# Patient Record
Sex: Female | Born: 1985 | Race: White | Hispanic: No | Marital: Married | Smoking: Never smoker
Health system: Southern US, Community
[De-identification: ages and names within clinical notes are randomized; demographics above are authoritative.]

## PROBLEM LIST (undated history)

## (undated) ENCOUNTER — Inpatient Hospital Stay (HOSPITAL_COMMUNITY): Payer: Self-pay

## (undated) DIAGNOSIS — J45909 Unspecified asthma, uncomplicated: Secondary | ICD-10-CM

## (undated) DIAGNOSIS — I341 Nonrheumatic mitral (valve) prolapse: Secondary | ICD-10-CM

---

## 2017-08-16 ENCOUNTER — Other Ambulatory Visit: Payer: Self-pay

## 2017-08-16 ENCOUNTER — Encounter (HOSPITAL_COMMUNITY): Payer: Self-pay | Admitting: *Deleted

## 2017-08-16 ENCOUNTER — Inpatient Hospital Stay (HOSPITAL_COMMUNITY)
Admission: AD | Admit: 2017-08-16 | Discharge: 2017-08-16 | Disposition: A | Discharge status: Home / Self Care | Payer: PPO | Source: Ambulatory Visit | Attending: Obstetrics and Gynecology | Admitting: Obstetrics and Gynecology

## 2017-08-16 ENCOUNTER — Inpatient Hospital Stay (HOSPITAL_COMMUNITY): Payer: PPO

## 2017-08-16 DIAGNOSIS — O009 Unspecified ectopic pregnancy without intrauterine pregnancy: Secondary | ICD-10-CM | POA: Insufficient documentation

## 2017-08-16 DIAGNOSIS — O00101 Right tubal pregnancy without intrauterine pregnancy: Secondary | ICD-10-CM

## 2017-08-16 DIAGNOSIS — O209 Hemorrhage in early pregnancy, unspecified: Secondary | ICD-10-CM

## 2017-08-16 DIAGNOSIS — N939 Abnormal uterine and vaginal bleeding, unspecified: Secondary | ICD-10-CM | POA: Diagnosis present

## 2017-08-16 DIAGNOSIS — Z880 Allergy status to penicillin: Secondary | ICD-10-CM | POA: Insufficient documentation

## 2017-08-16 HISTORY — DX: Nonrheumatic mitral (valve) prolapse: I34.1

## 2017-08-16 HISTORY — DX: Unspecified asthma, uncomplicated: J45.909

## 2017-08-16 LAB — URINALYSIS, ROUTINE W REFLEX MICROSCOPIC
BILIRUBIN URINE: NEGATIVE
Glucose, UA: NEGATIVE mg/dL
KETONES UR: NEGATIVE mg/dL
Nitrite: NEGATIVE
Protein, ur: NEGATIVE mg/dL
Specific Gravity, Urine: 1.02 (ref 1.005–1.030)
pH: 5 (ref 5.0–8.0)

## 2017-08-16 LAB — COMPREHENSIVE METABOLIC PANEL
ALBUMIN: 4 g/dL (ref 3.5–5.0)
ALK PHOS: 59 U/L (ref 38–126)
ALT: 15 U/L (ref 0–44)
ANION GAP: 10 (ref 5–15)
AST: 19 U/L (ref 15–41)
BILIRUBIN TOTAL: 0.9 mg/dL (ref 0.3–1.2)
BUN: 15 mg/dL (ref 6–20)
CALCIUM: 9.3 mg/dL (ref 8.9–10.3)
CO2: 20 mmol/L — ABNORMAL LOW (ref 22–32)
Chloride: 109 mmol/L (ref 98–111)
Creatinine, Ser: 0.8 mg/dL (ref 0.44–1.00)
GFR calc Af Amer: >60 mL/min (ref >60)
GLUCOSE: 89 mg/dL (ref 70–99)
POTASSIUM: 4.4 mmol/L (ref 3.5–5.1)
Sodium: 139 mmol/L (ref 135–145)
TOTAL PROTEIN: 6.8 g/dL (ref 6.5–8.1)

## 2017-08-16 LAB — CBC WITH DIFFERENTIAL/PLATELET
Basophils Absolute: 0 10*3/uL (ref 0.0–0.1)
Basophils Relative: 0 %
EOS PCT: 1 %
Eosinophils Absolute: 0.1 10*3/uL (ref 0.0–0.7)
HCT: 37.8 % (ref 36.0–46.0)
Hemoglobin: 12.5 g/dL (ref 12.0–15.0)
LYMPHS ABS: 2.6 10*3/uL (ref 0.7–4.0)
Lymphocytes Relative: 29 %
MCH: 31.5 pg (ref 26.0–34.0)
MCHC: 33.1 g/dL (ref 30.0–36.0)
MCV: 95.2 fL (ref 78.0–100.0)
MONO ABS: 0.4 10*3/uL (ref 0.1–1.0)
MONOS PCT: 4 %
Neutro Abs: 5.9 10*3/uL (ref 1.7–7.7)
Neutrophils Relative %: 66 %
PLATELETS: 320 10*3/uL (ref 150–400)
RBC: 3.97 MIL/uL (ref 3.87–5.11)
RDW: 13.1 % (ref 11.5–15.5)
WBC: 9 10*3/uL (ref 4.0–10.5)

## 2017-08-16 LAB — POCT PREGNANCY, URINE: Preg Test, Ur: POSITIVE — AB

## 2017-08-16 LAB — ABO/RH: ABO/RH(D): A POS

## 2017-08-16 LAB — HCG, QUANTITATIVE, PREGNANCY: hCG, Beta Chain, Quant, S: 156 m[IU]/mL — ABNORMAL HIGH (ref <5)

## 2017-08-16 MED ORDER — METHOTREXATE INJECTION FOR WOMEN'S HOSPITAL
50.0000 mg/m2 | Freq: Once | INTRAMUSCULAR | Status: AC
  Administered 2017-08-16: 80 mg via INTRAMUSCULAR
  Filled 2017-08-16: qty 1.6

## 2017-08-16 NOTE — MAU Provider Note (Signed)
History     CSN: [id number]  Arrival date and time: 08/16/17 1315   First Provider Initiated Contact with Patient 08/16/17 1354      Chief Complaint  Patient presents with  . Possible Pregnancy  . Vaginal Bleeding  . Abdominal Pain   HPI  Ms. Tiffany [name] is a 32 y.o. G1P0 at [redacted]w[redacted]d who presents to MAU today with complaint of vaginal bleeding and abdominal pain. The patient had what she thought was a normal period on 6/22. On 6/29 bleeding was much heavier and has been intermittent with clots since then. This morning she passed something that looked more like a sac than a clot. She states cramping is much less now and has not taken anything for pain recently. She denies N/V or fever.    OB History    Gravida  1   Para      Term      Preterm      AB      Living        SAB      TAB      Ectopic      Multiple      Live Births              Past Medical History:  Diagnosis Date  . Asthma   . Mitral valve prolapse     History reviewed. No pertinent surgical history.  History reviewed. No pertinent family history.  Social History   Tobacco Use  . Smoking status: Never Smoker  . Smokeless tobacco: Never Used  Substance Use Topics  . Alcohol use: Never    Frequency: Never  . Drug use: Never    Allergies:  Allergies  Allergen Reactions  . Penicillins Rash    No medications prior to admission.    Review of Systems  Constitutional: Negative for fever.  Gastrointestinal: Positive for abdominal pain. Negative for constipation, diarrhea, nausea and vomiting.  Genitourinary: Positive for vaginal bleeding. Negative for vaginal discharge.   Physical Exam   Blood pressure 120/68, pulse 75, temperature 98.3 F (36.8 C), temperature source Oral, resp. rate 16, height 5\' 2"  (1.575 m), weight 131 lb 4 oz (59.5 kg), last menstrual period 07/12/2017, SpO2 100 %.  Physical Exam  Nursing note and vitals reviewed. Constitutional: She is oriented to  person, place, and time. She appears well-developed and well-nourished. No distress.  HENT:  Head: Normocephalic and atraumatic.  Cardiovascular: Normal rate.  Respiratory: Effort normal.  GI: Soft. She exhibits no distension and no mass. There is tenderness (mild) in the right lower quadrant. There is no rebound and no guarding.  Genitourinary: Uterus is not enlarged and not tender. Cervix exhibits no motion tenderness, no discharge and no friability. Right adnexum displays tenderness (mild). Right adnexum displays no mass. Left adnexum displays no mass and no tenderness. There is bleeding (small, dark brown) in the vagina. No vaginal discharge found.  Neurological: She is alert and oriented to person, place, and time.  Skin: Skin is warm and dry. No erythema.  Psychiatric: She has a normal mood and affect.     Results for orders placed or performed during the hospital encounter of 08/16/17 (from the past 24 hour(s))  Urinalysis, Routine w reflex microscopic     Status: Abnormal   Collection Time: 08/16/17  1:51 PM  Result Value Ref Range   Color, Urine YELLOW YELLOW   APPearance HAZY (A) CLEAR   Specific Gravity, Urine 1.020 1.005 - 1.030  pH 5.0 5.0 - 8.0   Glucose, UA NEGATIVE NEGATIVE mg/dL   Hgb urine dipstick LARGE (A) NEGATIVE   Bilirubin Urine NEGATIVE NEGATIVE   Ketones, ur NEGATIVE NEGATIVE mg/dL   Protein, ur NEGATIVE NEGATIVE mg/dL   Nitrite NEGATIVE NEGATIVE   Leukocytes, UA SMALL (A) NEGATIVE   RBC / HPF 0-5 0 - 5 RBC/hpf   WBC, UA 11-20 0 - 5 WBC/hpf   Bacteria, UA RARE (A) NONE SEEN   Squamous Epithelial / LPF 11-20 0 - 5   Mucus PRESENT   Pregnancy, urine POC     Status: Abnormal   Collection Time: 08/16/17  1:56 PM  Result Value Ref Range   Preg Test, Ur POSITIVE (A) NEGATIVE  CBC with Differential/Platelet     Status: None   Collection Time: 08/16/17  2:12 PM  Result Value Ref Range   WBC 9.0 4.0 - 10.5 K/uL   RBC 3.97 3.87 - 5.11 MIL/uL   Hemoglobin 12.5  12.0 - 15.0 g/dL   HCT 16.137.8 09.636.0 - 04.546.0 %   MCV 95.2 78.0 - 100.0 fL   MCH 31.5 26.0 - 34.0 pg   MCHC 33.1 30.0 - 36.0 g/dL   RDW 40.913.1 81.111.5 - 91.415.5 %   Platelets 320 150 - 400 K/uL   Neutrophils Relative % 66 %   Neutro Abs 5.9 1.7 - 7.7 K/uL   Lymphocytes Relative 29 %   Lymphs Abs 2.6 0.7 - 4.0 K/uL   Monocytes Relative 4 %   Monocytes Absolute 0.4 0.1 - 1.0 K/uL   Eosinophils Relative 1 %   Eosinophils Absolute 0.1 0.0 - 0.7 K/uL   Basophils Relative 0 %   Basophils Absolute 0.0 0.0 - 0.1 K/uL  ABO/Rh     Status: None (Preliminary result)   Collection Time: 08/16/17  2:12 PM  Result Value Ref Range   ABO/RH(D)      A POS Performed at [facility], [address] [place], [place] [id number]   hCG, quantitative, pregnancy     Status: Abnormal   Collection Time: 08/16/17  2:12 PM  Result Value Ref Range   hCG, Beta Chain, Quant, S 156 (H) <5 mIU/mL  Comprehensive metabolic panel     Status: Abnormal   Collection Time: 08/16/17  2:12 PM  Result Value Ref Range   Sodium 139 135 - 145 mmol/L   Potassium 4.4 3.5 - 5.1 mmol/L   Chloride 109 98 - 111 mmol/L   CO2 20 (L) 22 - 32 mmol/L   Glucose, Bld 89 70 - 99 mg/dL   BUN 15 6 - 20 mg/dL   Creatinine, Ser 6.210.80 0.44 - 1.00 mg/dL   Calcium 9.3 8.9 - 30.810.3 mg/dL   Total Protein 6.8 6.5 - 8.1 g/dL   Albumin 4.0 3.5 - 5.0 g/dL   AST 19 15 - 41 U/L   ALT 15 0 - 44 U/L   Alkaline Phosphatase 59 38 - 126 U/L   Total Bilirubin 0.9 0.3 - 1.2 mg/dL   GFR calc non Af Amer >60 >60 mL/min   GFR calc Af Amer >60 >60 mL/min   Anion gap 10 5 - 15   [place] Ob Comp Less 14 Wks  Result Date: 08/16/2017 CLINICAL DATA:  Vaginal bleeding.  Uncertain LMP. EXAM: OBSTETRIC <14 WK US AND TRANSVAGINAL OB US TECHNIQUE: Both transabdominal and transvaginal ultrasound examinations were performed for complete evaluation of the gestation as well as the maternal uterus, adnexal regions, and pelvic cul-de-sac. Transvaginal  technique was performed to assess  early pregnancy. COMPARISON:  None. FINDINGS: Intrauterine gestational sac: None Maternal uterus/adnexae: Thin endometrium is seen measuring 4 mm. The left ovary is normal appearance. A heterogeneous mass is seen in the inferior right adnexa and cul-de-sac, which abuts the right ovary but appears separate from it. This mass measures 4.5 x 3.2 x 2.9 cm. There is a tiny amount of simple free fluid in the pelvic cul-de-sac. IMPRESSION: 4.5 cm right adnexal mass, highly suspicious for ectopic pregnancy. Tiny amount of simple free fluid in cul-de-sac. Critical Value/emergent results were called by telephone at the time of interpretation on 08/16/2017 at 3:01 pm to Abby Polos in MAU, who verbally acknowledged these results. Electronically Signed   By: Myles Rosenthal M.D.   On: 08/16/2017 15:02   US Ob Transvaginal  Result Date: 08/16/2017 CLINICAL DATA:  Vaginal bleeding.  Uncertain LMP. EXAM: OBSTETRIC <14 WK [place] AND TRANSVAGINAL OB US TECHNIQUE: Both transabdominal and transvaginal ultrasound examinations were performed for complete evaluation of the gestation as well as the maternal uterus, adnexal regions, and pelvic cul-de-sac. Transvaginal technique was performed to assess early pregnancy. COMPARISON:  None. FINDINGS: Intrauterine gestational sac: None Maternal uterus/adnexae: Thin endometrium is seen measuring 4 mm. The left ovary is normal appearance. A heterogeneous mass is seen in the inferior right adnexa and cul-de-sac, which abuts the right ovary but appears separate from it. This mass measures 4.5 x 3.2 x 2.9 cm. There is a tiny amount of simple free fluid in the pelvic cul-de-sac. IMPRESSION: 4.5 cm right adnexal mass, highly suspicious for ectopic pregnancy. Tiny amount of simple free fluid in cul-de-sac. Critical Value/emergent results were called by telephone at the time of interpretation on 08/16/2017 at 3:01 pm to Abby Polos in MAU, who verbally acknowledged these results. Electronically Signed   By: Myles Rosenthal M.D.   On: 08/16/2017 15:02    MAU Course  Procedures None  MDM +UPT UA, ABO/Rh, quant hCG and [place] today to rule out ectopic pregnancy Right sided mass concerning for ectopic pregnancy noted on [place]. Discussed with Dr. Earlene Plater. She has discussed results and management options with the patient and her husband. They have opted for MTX management. CMP ordered. MTX ordered and given in MAU today.  Assessment and Plan  A: Right ectopic pregnancy  P: Discharge home Tylenol PRN for pain advised Care after MTX instructions given  Ectopic precautions and warning signs for worsening condition discussed Patient advised to follow-up in MAU for day#4 hCG on Saturday or sooner if her condition were to change or worsen   Vonzella Nipple, PA-C 08/16/2017, 6:52 PM

## 2017-08-16 NOTE — Discharge Instructions (Signed)
Methotrexate Treatment for an Ectopic Pregnancy, Care After Refer to this sheet in the next few weeks. These instructions provide you with information on caring for yourself after your procedure. Your health care provider may also give you more specific instructions. Your treatment has been planned according to current medical practices, but problems sometimes occur. Call your health care provider if you have any problems or questions after your procedure. What can I expect after the procedure? You may have some abdominal cramping, vaginal bleeding, and fatigue in the first few days after taking methotrexate. Some other possible side effects of methotrexate include:  Nausea.  Vomiting.  Diarrhea.  Mouth sores.  Swelling or irritation of the lining of your lungs (pneumonitis).  Liver damage.  Hair loss.  Follow these instructions at home: After you have received the methotrexate medicine, you need to be careful of your activities and watch your condition for several weeks. It may take 1 week before your hormone levels return to normal. Activity  Do not have sexual intercourse until your health care provider says it is safe to do so.  You may resume your usual diet.  Limit strenuous activity.  Do not drink alcohol. General instructions  Do not take aspirin, ibuprofen, or naproxen (nonsteroidal anti-inflammatory drugs [NSAIDs]).  Do not take folic acid, prenatal vitamins, or other vitamins that contain folic acid.  Avoid traveling too far away from your health care provider.  Keep all follow-up visits as told by your health care provider. This is important. Contact a health care provider if:  You cannot control your nausea and vomiting.  You cannot control your diarrhea.  You have sores in your mouth and want treatment.  You need pain medicine for your abdominal pain.  You have a rash.  You are having a reaction to the medicine. Get help right away if:  You have  increasing abdominal or pelvic pain.  You notice increased bleeding.  You feel light-headed, or you faint.  You have shortness of breath.  Your heart rate increases.  You have a cough.  You have chills.  You have a fever. This information is not intended to replace advice given to you by your health care provider. Make sure you discuss any questions you have with your health care provider. Document Released: 01/20/2011 Document Revised: 07/09/2015 Document Reviewed: 11/19/2012 Elsevier Interactive Patient Education  2017 Elsevier Inc.   Pelvic Rest Pelvic rest may be recommended if:  Your placenta is partially or completely covering the opening of your cervix (placenta previa).  There is bleeding between the wall of the uterus and the amniotic sac in the first trimester of pregnancy (subchorionic hemorrhage).  You went into labor too early (preterm labor).  Based on your overall health and the health of your baby, your health care provider will decide if pelvic rest is right for you. How do I rest my pelvis? For as long as told by your health care provider:  Do not have sex, sexual stimulation, or an orgasm.  Do not use tampons. Do not douche. Do not put anything in your vagina.  Do not lift anything that is heavier than 10 lb (4.5 kg).  Avoid activities that take a lot of effort (are strenuous).  Avoid any activity in which your pelvic muscles could become strained.  When should I seek medical care? Seek medical care if you have:  Cramping pain in your lower abdomen.  Vaginal discharge.  A low, dull backache.  Regular contractions.  Uterine  tightening.  When should I seek immediate medical care? Seek immediate medical care if:  You have vaginal bleeding and you are pregnant.  This information is not intended to replace advice given to you by your health care provider. Make sure you discuss any questions you have with your health care provider. Document  Released: 05/28/2010 Document Revised: 07/09/2015 Document Reviewed: 08/04/2014 Elsevier Interactive Patient Education  [employer].

## 2017-08-16 NOTE — Progress Notes (Signed)
Faculty Note  Patient is 32 yo Tiffany [name] who presented with heavy bleeding for several days. She started what she thought was regular period on 08/05/17, bled normally for several days, then 4 days ago, bleeding got significantly heavier and was also bright red, which is abnormal for her. She is also passing some mucous discharge today. She reports some right lower pelvic pain about 10 days ago, crampy and intense that resolved on its own. Currently, she is sore in the right lower quadrant but does not really describe it was pain. She was not aware until recently that she was pregnant. This is first pregnancy. She stopped OCPs 12/2016, she and husband are not actively attempting to conceive but she wanted to get her cycle regular again and see what would happen.  PMH: mild asthma, mitral valve prolapse (occasional chest pains but no meds or other complications) PSH: none Allergies: PCN Meds: inhaler  BP 120/68 (BP Location: Right Arm)   Pulse 75   Temp 98.3 F (36.8 C) (Oral)   Resp 16   Wt 131 lb 4 oz (59.5 kg)   LMP 07/12/2017 Comment: ? June  SpO2 100%  Gen: alert, oriented Abd: soft, mildly tender to palpation in RLQ with deep palpation  HCG: 156 CLINICAL DATA:  Vaginal bleeding.  Uncertain LMP.  EXAM: OBSTETRIC <14 WK US AND TRANSVAGINAL OB US  TECHNIQUE: Both transabdominal and transvaginal ultrasound examinations were performed for complete evaluation of the gestation as well as the maternal uterus, adnexal regions, and pelvic cul-de-sac. Transvaginal technique was performed to assess early pregnancy.  COMPARISON:  None.  FINDINGS: Intrauterine gestational sac: None  Maternal uterus/adnexae: Thin endometrium is seen measuring 4 mm. The left ovary is normal appearance. A heterogeneous mass is seen in the inferior right adnexa and cul-de-sac, which abuts the right ovary but appears separate from it. This mass measures 4.5 x 3.2 x 2.9 cm. There is a tiny amount of simple  free fluid in the pelvic cul-de-sac.  IMPRESSION: 4.5 cm right adnexal mass, highly suspicious for ectopic pregnancy. Tiny amount of simple free fluid in cul-de-sac.  Critical Value/emergent results were called by telephone at the time of interpretation on 08/16/2017 at 3:01 pm to Abby Polos in MAU, who verbally acknowledged these results.   Electronically Signed   By: Myles RosenthalJohn  Stahl M.D.   On: 08/16/2017 15:02   A/P: 32 yo Tiffany [name] with vaginal bleeding and mild RLQ in the setting of low HCG and right adnexal mass suspicious for ectopic pregnancy. I reviewed course with patient and husband, concern for ectopic pregnancy versus early IUP (less likely given history of significant bleeding and adnexal mass). I thoroughly reviewed options with them including return in 48 hrs for repeat HCG and ultrasound, (risks/benefits of this including interrupting early pregnancy, possible rupture of ectopic, ectopic becoming too large for methotrexate therapy), getting methotrexate therapy now (risks/benefits including disruption of ectopic pregnancy, disruption of early pregnancy, possible rupture of ectopic, possibility that therapy won't work or would need to be repeated), and surgical management (including not visualizing ectopic, possible disruption of early pregnancy). Answered all questions. They have opted for MTX therapy, please see note from Vonzella NippleJulie Wenzel, PA for further details.  Course discussed with Dr. Despina HiddenEure as well.   Baldemar LenisK. Meryl Davis, M.D. Attending Obstetrician & Gynecologist, [facility], [facility]

## 2017-08-16 NOTE — MAU Note (Signed)
Started having really severe cramping on June 19, cramped another 2 days, bleeding started June 20/21 started as light brown, 22nd- seemed like regular period, just with worse cramping then usual. Then it seemed to go away. On the 29th, bleeding started and got worse, deeper red; heavier w/blood clots.  Better Sunday, Lighter on Mon/ Tues.  This morning had lighter pink, ? Sac  Has not been seen in office for any of this.  6/30 had +HPT.

## 2017-08-19 ENCOUNTER — Encounter (HOSPITAL_COMMUNITY): Payer: Self-pay | Admitting: *Deleted

## 2017-08-19 ENCOUNTER — Inpatient Hospital Stay (HOSPITAL_COMMUNITY)
Admission: AD | Admit: 2017-08-19 | Discharge: 2017-08-19 | Disposition: A | Discharge status: Home / Self Care | Payer: PPO | Source: Ambulatory Visit | Attending: Obstetrics and Gynecology | Admitting: Obstetrics and Gynecology

## 2017-08-19 ENCOUNTER — Inpatient Hospital Stay (HOSPITAL_COMMUNITY): Payer: PPO

## 2017-08-19 DIAGNOSIS — O009 Unspecified ectopic pregnancy without intrauterine pregnancy: Secondary | ICD-10-CM

## 2017-08-19 DIAGNOSIS — O00101 Right tubal pregnancy without intrauterine pregnancy: Secondary | ICD-10-CM | POA: Insufficient documentation

## 2017-08-19 LAB — HCG, QUANTITATIVE, PREGNANCY: HCG, BETA CHAIN, QUANT, S: 39 m[IU]/mL — AB (ref <5)

## 2017-08-19 NOTE — MAU Provider Note (Signed)
Ms. Tiffany BosworthJessica [name]  is a 32 y.o. G1P0  at 5926w3d who presents to MAU today for follow-up quant hCG. This is day 4 s/p MTX for ectopic pregnancy. She reports increase in pain and bleeding today. Rates pain 5/10. Worse in RLQ.   BP 109/64 (BP Location: Right Arm)   Pulse 78   Temp 98.4 F (36.9 C) (Oral)   Resp 16   Wt 131 lb 1.3 oz (59.5 kg)   LMP 07/12/2017 Comment: ? June  SpO2 98% Comment: ra  BMI 23.97 kg/m   GENERAL: Well-developed, well-nourished female in no acute distress.  HEENT: Normocephalic, atraumatic.   LUNGS: Effort normal HEART: Regular rate  SKIN: Warm, dry and without erythema PSYCH: Normal mood and affect   [place] Ob Transvaginal  Result Date: 08/19/2017 CLINICAL DATA:  [age] year old female with right adnexal ectopic pregnancy identified 3 days prior status post 1 dose of methotrexate, now presenting with worsening pelvic pain and vaginal bleeding. EXAM: TRANSVAGINAL OB ULTRASOUND TECHNIQUE: Transvaginal ultrasound was performed for complete evaluation of the gestation as well as the maternal uterus, adnexal regions, and pelvic cul-de-sac. COMPARISON:  08/16/2017 obstetric scan. FINDINGS: Anteverted uterus is normal in size and configuration, with no uterine fibroids or other myometrial abnormalities. No intrauterine gestational sac. Thin endometrium (4 mm bilayer thickness). Trace heterogeneous fluid in the endometrial cavity. Small to moderate free fluid in the pelvic cul-de-sac and right adnexa, not appreciably changed. Right ovary measures 2.8 x 1.9 x 1.9 cm. There is a heterogeneous 2.3 x 1.7 x 2.0 cm right adnexal mass separate from the right ovary, located between the right ovary and uterus, compatible with right tubal ectopic gestation, previously 2.4 x 1.8 x 2.1 cm on 08/16/2017 using similar measurement technique, stable to slightly decreased. No yolk sac or embryo within this right adnexal mass. Additional heterogeneous hypoechoic regions in the right adnexa are unchanged  and compatible with hematosalpinx. Left ovary measures 2.3 x 1.4 x 1.9 cm. No left ovarian or left adnexal mass. IMPRESSION: 1. Right tubal 2.3 x 1.7 x 2.0 cm ectopic gestation, stable to slightly decreased in size since 08/16/2017 scan. No yolk sac or embryo within this mass. 2. Stable hematosalpinx in the right adnexa. Stable small to moderate free fluid in the right adnexa and pelvic cul-de-sac. 3. Thin endometrium with trace blood products in the endometrial cavity. Electronically Signed   By: Delbert PhenixJason A Poff M.D.   On: 08/19/2017 15:32   Results for orders placed or performed during the hospital encounter of 08/19/17 (from the past 24 hour(s))  hCG, quantitative, pregnancy     Status: Abnormal   Collection Time: 08/19/17  1:35 PM  Result Value Ref Range   hCG, Beta Chain, Quant, S 39 (H) <5 mIU/mL   Component     Latest Ref Rng & Units 08/16/2017 08/19/2017  HCG, Beta Chain, Quant, S     <5 mIU/mL 156 (H) 39 (H)     A: 1. Ectopic pregnancy   -significant drop in HCG & decrease in size of adnexal mass (4.5 cm > 2.3 cm) -reviewed presentation, HCG, & ultrasound with Dr. Jolayne Pantheronstant. Ok to discharge home  P: Discharge home Scheduled for day 7 HCG in clinic on Tuesday Discussed reasons to return to MAU  Judeth HornLawrence, Temitope Griffing, NP  08/19/2017 3:49 PM

## 2017-08-19 NOTE — MAU Note (Signed)
Tiffany BosworthJessica [name] is a 32 y.o. at 4317w3d here in MAU reporting:  For follow up Has received methotrexate Reports increase in pain and bleeding today Pain score: 5/10 Vitals:   08/19/17 1314  BP: 109/64  Pulse: 78  Resp: 16  Temp: 98.4 F (36.9 C)  SpO2: 98%     Lab orders placed from triage: none

## 2017-08-19 NOTE — Discharge Instructions (Signed)
Methotrexate Treatment for an Ectopic Pregnancy, Care After °Refer to this sheet in the next few weeks. These instructions provide you with information on caring for yourself after your procedure. Your health care provider may also give you more specific instructions. Your treatment has been planned according to current medical practices, but problems sometimes occur. Call your health care provider if you have any problems or questions after your procedure. °What can I expect after the procedure? °You may have some abdominal cramping, vaginal bleeding, and fatigue in the first few days after taking methotrexate. Some other possible side effects of methotrexate include: °· Nausea. °· Vomiting. °· Diarrhea. °· Mouth sores. °· Swelling or irritation of the lining of your lungs (pneumonitis). °· Liver damage. °· Hair loss. ° °Follow these instructions at home: °After you have received the methotrexate medicine, you need to be careful of your activities and watch your condition for several weeks. It may take 1 week before your hormone levels return to normal. °Activity °· Do not have sexual intercourse until your health care provider says it is safe to do so. °· You may resume your usual diet. °· Limit strenuous activity. °· Do not drink alcohol. °General instructions °· Do not take aspirin, ibuprofen, or naproxen (nonsteroidal anti-inflammatory drugs [NSAIDs]). °· Do not take folic acid, prenatal vitamins, or other vitamins that contain folic acid. °· Avoid traveling too far away from your health care provider. °· Keep all follow-up visits as told by your health care provider. This is important. °Contact a health care provider if: °· You cannot control your nausea and vomiting. °· You cannot control your diarrhea. °· You have sores in your mouth and want treatment. °· You need pain medicine for your abdominal pain. °· You have a rash. °· You are having a reaction to the medicine. °Get help right away if: °· You have  increasing abdominal or pelvic pain. °· You notice increased bleeding. °· You feel light-headed, or you faint. °· You have shortness of breath. °· Your heart rate increases. °· You have a cough. °· You have chills. °· You have a fever. °This information is not intended to replace advice given to you by your health care provider. Make sure you discuss any questions you have with your health care provider. °Document Released: 01/20/2011 Document Revised: 07/09/2015 Document Reviewed: 11/19/2012 °Elsevier Interactive Patient Education © 2017 Elsevier Inc. ° °

## 2017-08-22 ENCOUNTER — Ambulatory Visit (INDEPENDENT_AMBULATORY_CARE_PROVIDER_SITE_OTHER): Payer: PPO

## 2017-08-22 DIAGNOSIS — Z8759 Personal history of other complications of pregnancy, childbirth and the puerperium: Secondary | ICD-10-CM

## 2017-08-22 LAB — HCG, QUANTITATIVE, PREGNANCY: HCG, BETA CHAIN, QUANT, S: 9 m[IU]/mL — AB (ref <5)

## 2017-08-22 NOTE — Progress Notes (Signed)
Pt here today for STAT beta lab s/p day 7 MTX.  Pt reports having some minor cramping and light spotting.  Pt advised to wait in the waiting room for approx two hours to receive results and f/u.  Pt agreed.   Notified Sharen CounterLisa Leftwich-Kirby, CNM pt's results.  Provider recommendation to f/u in one week for non-stat beta lab to determine f/u at that time.  Informed pt of provider's recommendation.  Pt stated understanding.

## 2017-08-28 ENCOUNTER — Other Ambulatory Visit: Payer: Self-pay | Admitting: *Deleted

## 2017-08-28 DIAGNOSIS — Z8759 Personal history of other complications of pregnancy, childbirth and the puerperium: Secondary | ICD-10-CM

## 2017-08-29 ENCOUNTER — Other Ambulatory Visit: Payer: PPO

## 2017-08-29 DIAGNOSIS — Z8759 Personal history of other complications of pregnancy, childbirth and the puerperium: Secondary | ICD-10-CM

## 2017-08-29 DIAGNOSIS — R8290 Unspecified abnormal findings in urine: Secondary | ICD-10-CM

## 2017-08-29 LAB — POCT URINALYSIS DIP (DEVICE)
BILIRUBIN URINE: NEGATIVE
GLUCOSE, UA: NEGATIVE mg/dL
KETONES UR: NEGATIVE mg/dL
Nitrite: POSITIVE — AB
PROTEIN: NEGATIVE mg/dL
Specific Gravity, Urine: 1.01 (ref 1.005–1.030)
Urobilinogen, UA: 0.2 mg/dL (ref 0.0–1.0)
pH: 5.5 (ref 5.0–8.0)

## 2017-08-29 MED ORDER — PHENAZOPYRIDINE HCL 200 MG PO TABS: 200.0000 mg | ORAL_TABLET | Freq: Three times a day (TID) | ORAL | 0 refills | Status: AC | PRN

## 2017-08-29 MED ORDER — SULFAMETHOXAZOLE-TRIMETHOPRIM 800-160 MG PO TABS: 1.0000 | ORAL_TABLET | Freq: Two times a day (BID) | ORAL | 0 refills | Status: AC

## 2017-08-29 NOTE — Progress Notes (Signed)
Pt reports having some pain with urination.  UA resulted with + nitrates and blood.  Verified pt's allergies.  E-prescribed Bactrim and Pyridium per standard protocol.  Urine sent for culture.  Informed pt that if anything came back that she would need a different tx then we would give her a call back.  Pt stated understanding with no further questions.

## 2017-08-30 LAB — BETA HCG QUANT (REF LAB)

## 2017-08-31 LAB — URINE CULTURE

## 2017-09-04 ENCOUNTER — Telehealth: Payer: Self-pay | Admitting: General Practice

## 2017-09-04 NOTE — Telephone Encounter (Signed)
-----   Message from Armando ReichertHeather D Hogan, CNM sent at 09/02/2017  8:52 AM EDT ----- Patient has a UTI. I have sent in RX to her pharmacy. Please call her.

## 2017-09-04 NOTE — Telephone Encounter (Signed)
Per Sharen CounterLisa Leftwich-Kirby, This pt had f/u hcg after MTX for ectopic pregnancy. Her hcg on 08/29/17 was <1 so she does not need any additional labs in our office. Please call to let her know. Thank you.   Called patient & informed her of all results. Patient verbalized understanding & asked if some tenderness/soreness was normal. Told patient yes some would still be expected but should improve. Patient verbalized understanding & had no other questions.

## 2018-07-11 ENCOUNTER — Encounter (HOSPITAL_COMMUNITY): Payer: Self-pay

## 2019-09-21 IMAGING — US US OB COMP LESS 14 WK
1 series · 15 of 28 positions shown · non-contrast
Comparison: None.

CLINICAL DATA: Vaginal bleeding.  Uncertain LMP.

EXAM:
OBSTETRIC <14 WK US AND TRANSVAGINAL OB US
TECHNIQUE: Both transabdominal and transvaginal ultrasound examinations were
performed for complete evaluation of the gestation as well as the
maternal uterus, adnexal regions, and pelvic cul-de-sac.
Transvaginal technique was performed to assess early pregnancy.

[Series 1: us ob comp less 14 wk · 73 acquisitions, 15 frames shown]
[im 1/73]
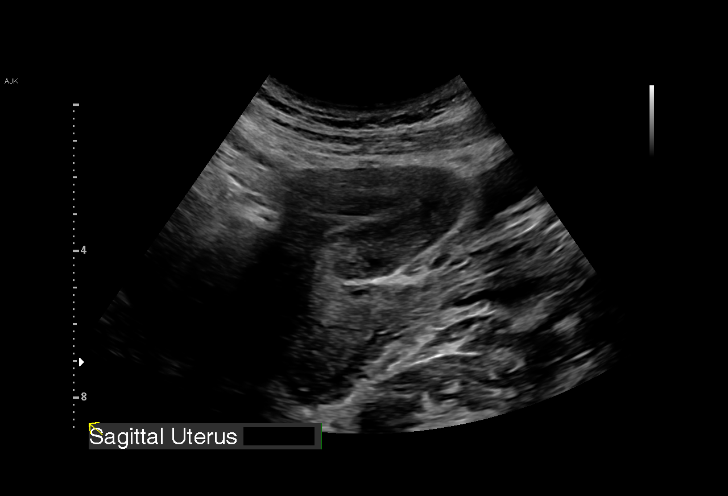
[im 6/73]
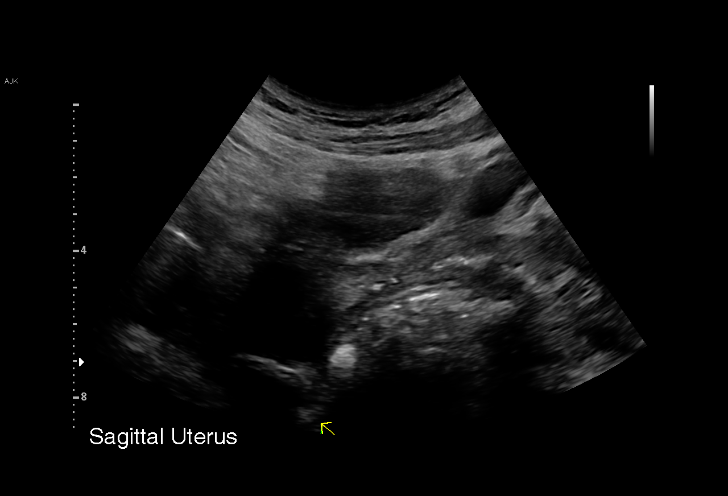
[im 11/73]
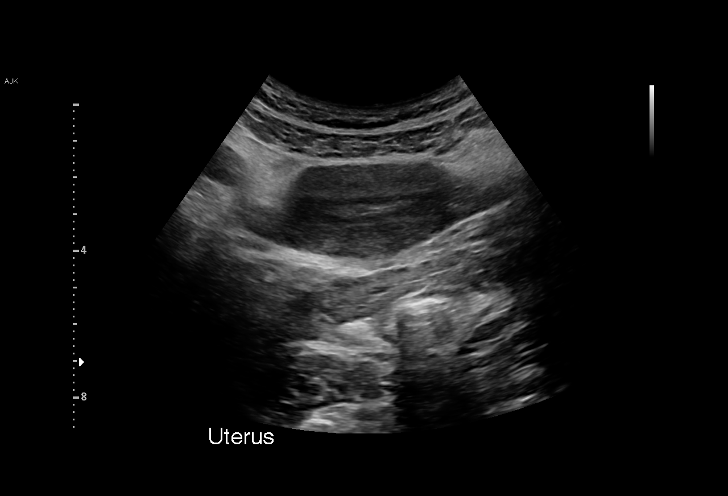
[im 17/73]
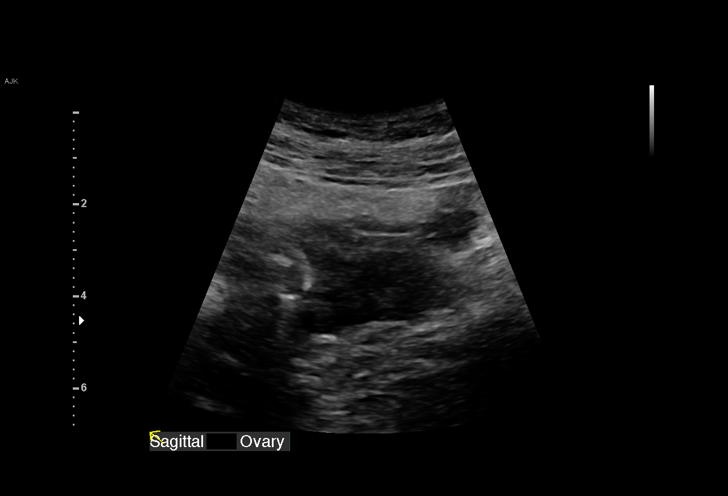
[im 22/73]
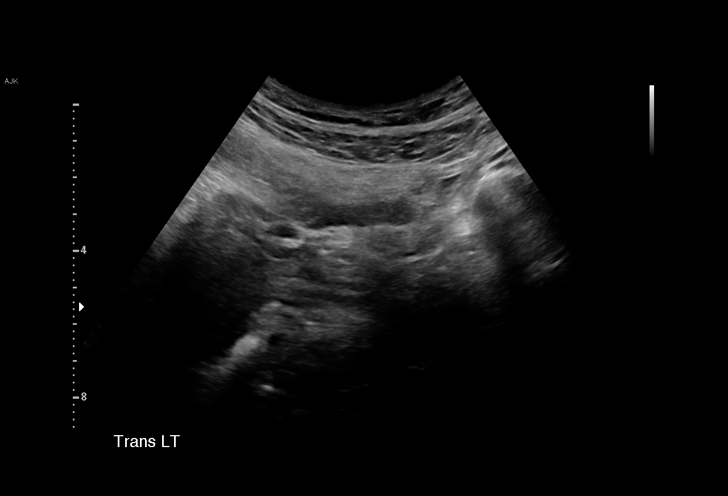
[im 27/73]
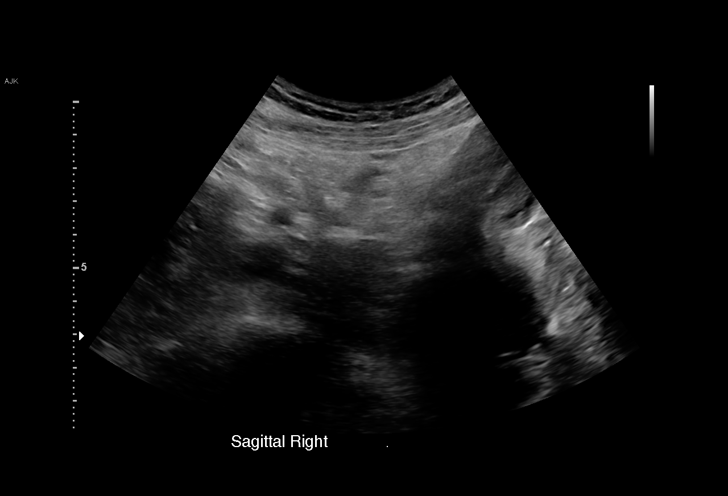
[im 33/73]
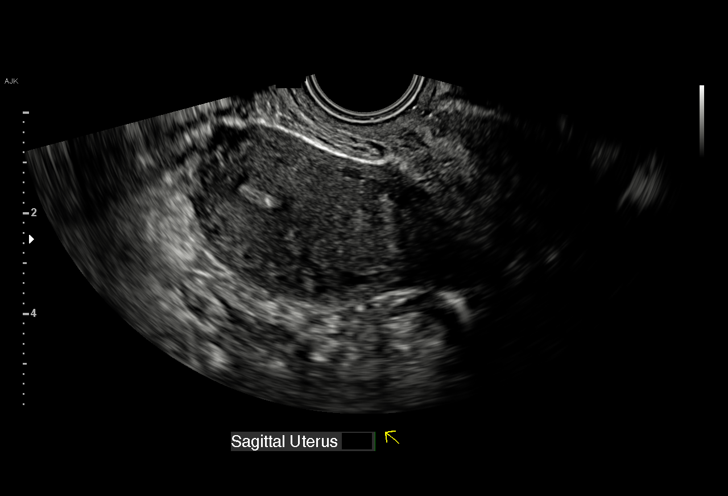
[im 38/73]
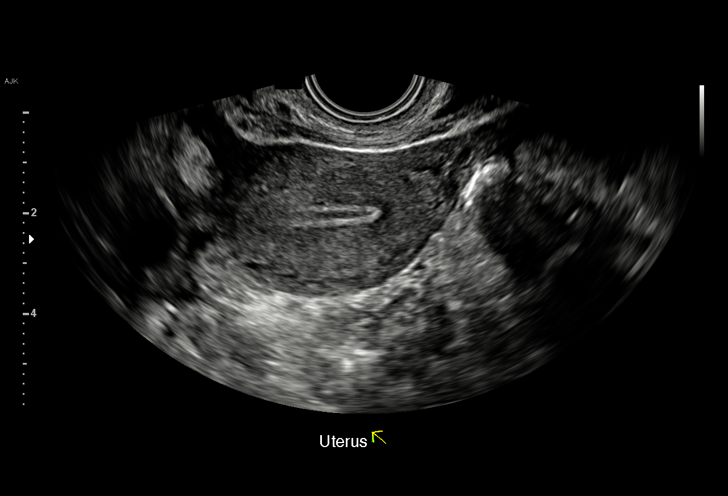
[im 41/73]
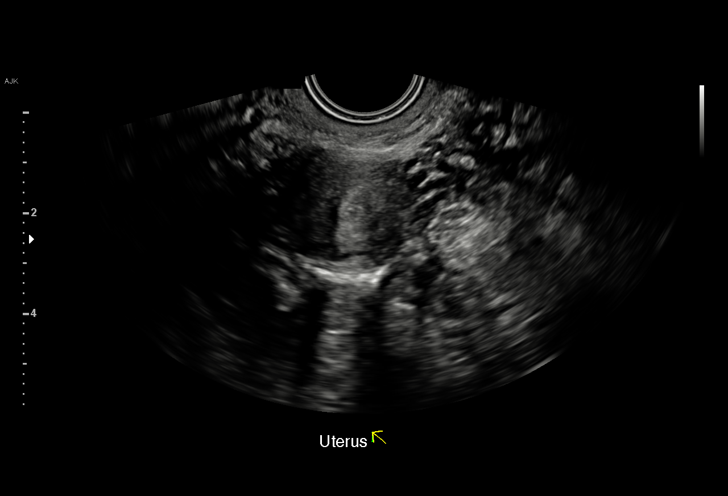
[im 46/73]
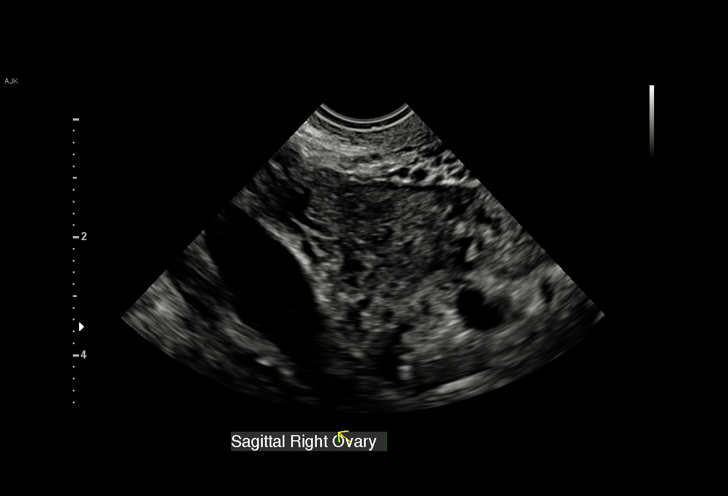
[im 51/73]
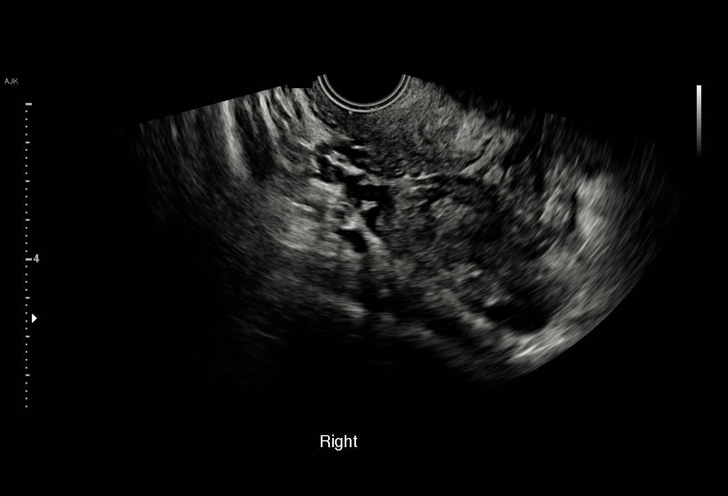
[im 57/73]
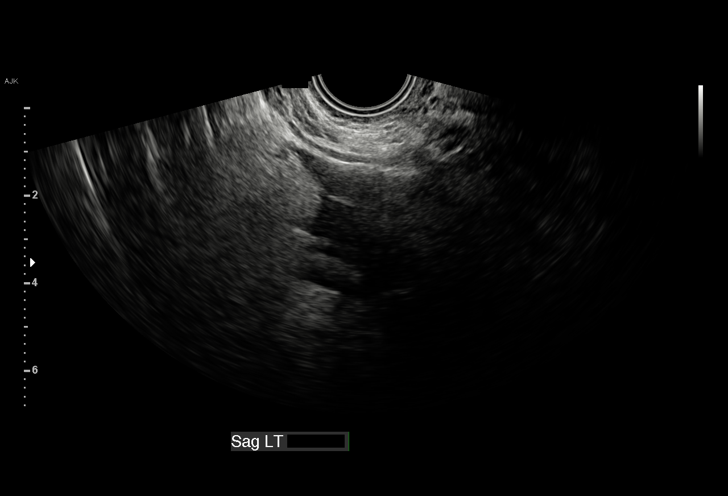
[im 62/73]
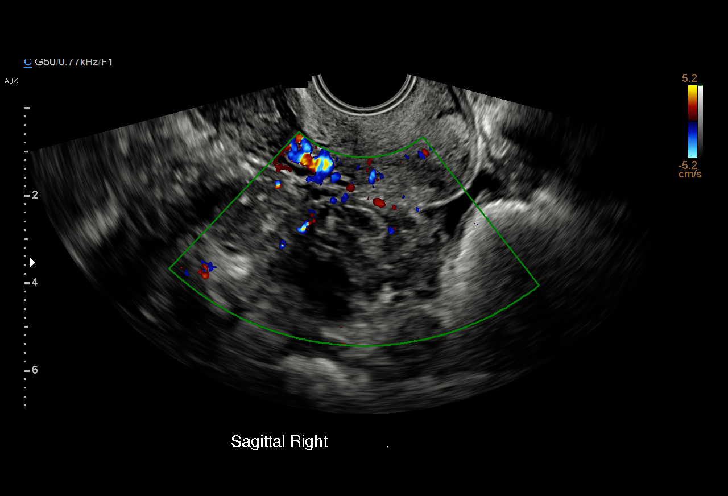
[im 67/73]
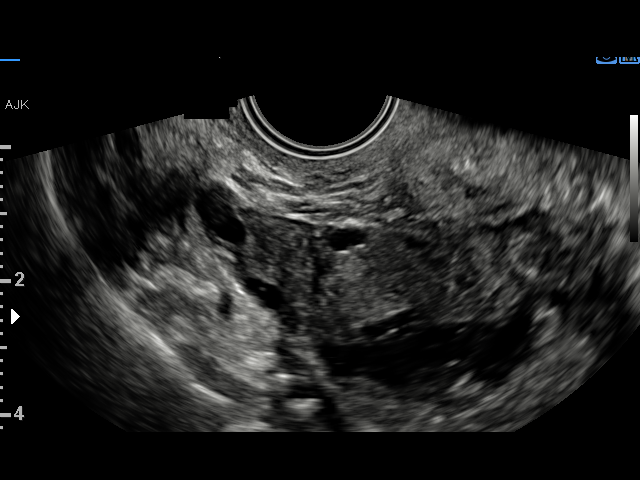
[im 73/73]
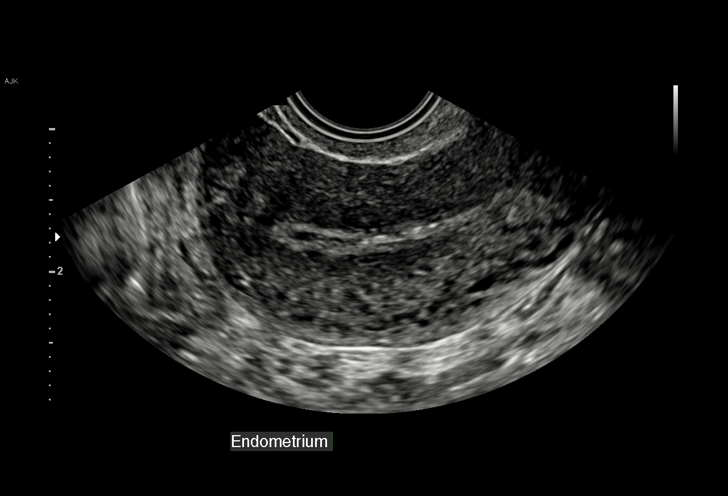

[15 of 28 positions shown; findings below may reference images not displayed]

FINDINGS: Intrauterine gestational sac: None

Maternal uterus/adnexae: Thin endometrium is seen measuring 4 mm.
The left ovary is normal appearance. A heterogeneous mass is seen in
the inferior right adnexa and cul-de-sac, which abuts the right
ovary but appears separate from it. This mass measures 4.5 x 3.2 x
2.9 cm. There is a tiny amount of simple free fluid in the pelvic
cul-de-sac.
IMPRESSION: 4.5 cm right adnexal mass, highly suspicious for ectopic pregnancy.
Tiny amount of simple free fluid in cul-de-sac.

Critical Value/emergent results were called by telephone at the time
of interpretation on 08/16/2017 at [DATE] to Nasiruu Fa Same Love in [facility], who
verbally acknowledged these results.

## 2019-09-24 IMAGING — US US OB TRANSVAGINAL
1 series · 15 of 28 positions shown · non-contrast
Comparison: 08/16/2017 obstetric scan.

CLINICAL DATA: 32-year-old female with right adnexal ectopic
pregnancy identified 3 days prior status post 1 dose of
methotrexate, now presenting with worsening pelvic pain and vaginal
bleeding.

EXAM:
TRANSVAGINAL OB ULTRASOUND
TECHNIQUE: Transvaginal ultrasound was performed for complete evaluation of the
gestation as well as the maternal uterus, adnexal regions, and
pelvic cul-de-sac.

[Series 1: us ob transvaginal · 37 acquisitions, 15 frames shown]
[im 1/37]
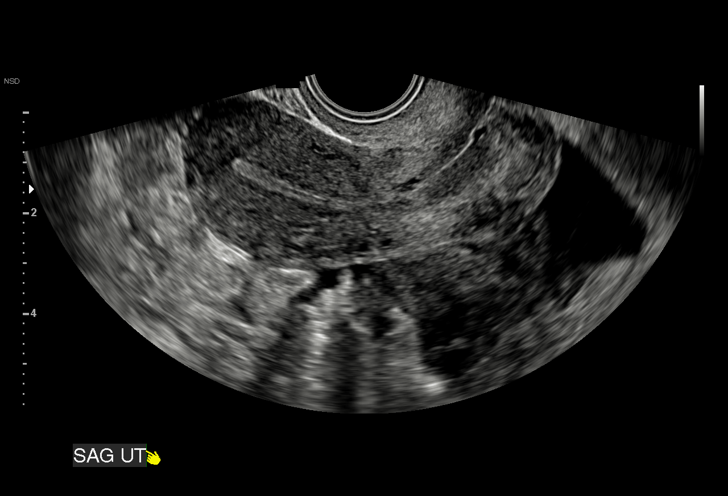
[im 3/37]
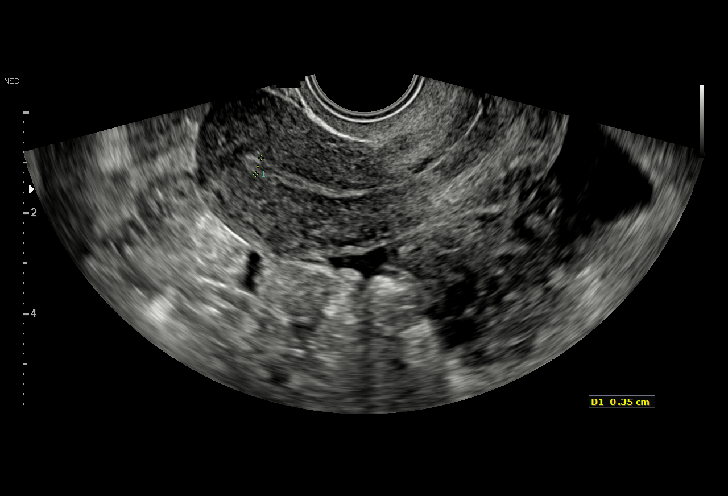
[im 6/37]
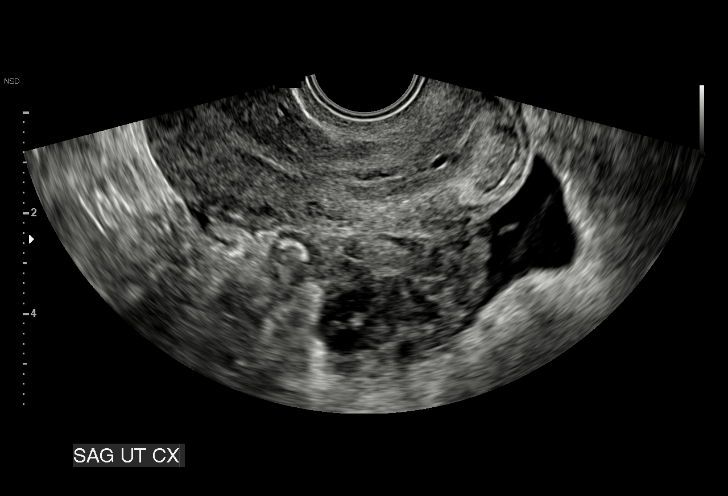
[im 9/37]
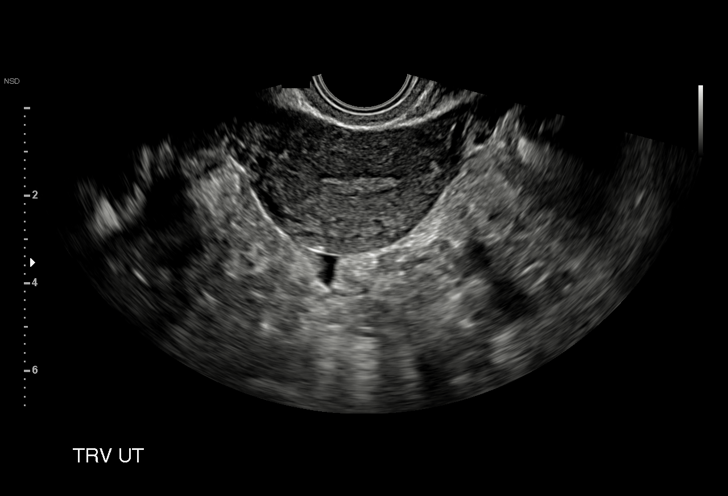
[im 11/37]
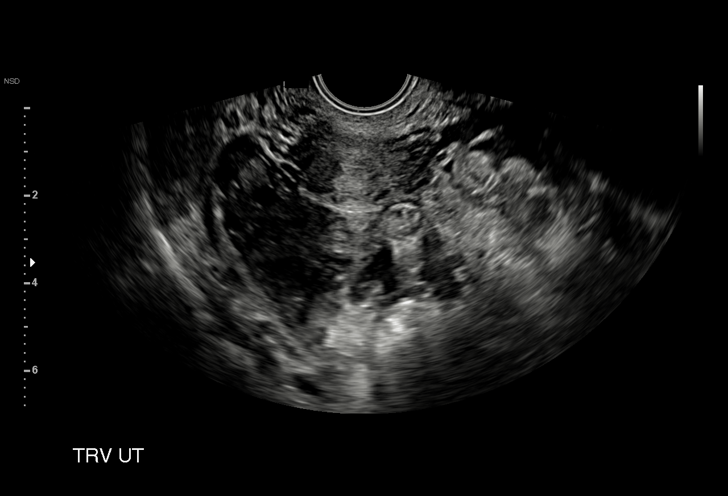
[im 14/37]
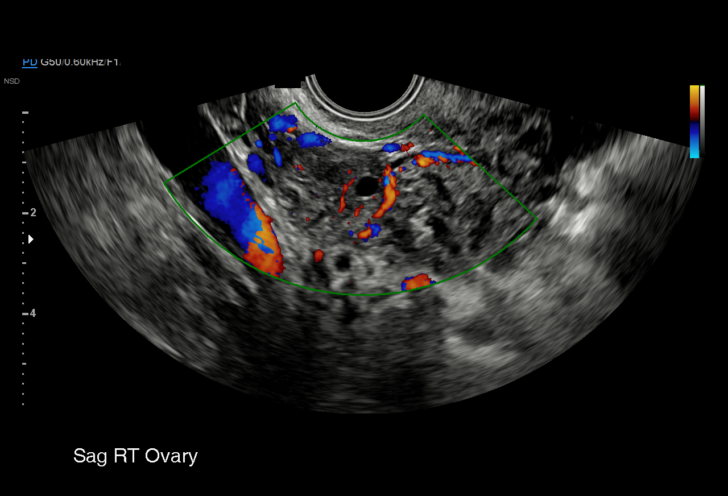
[im 17/37]
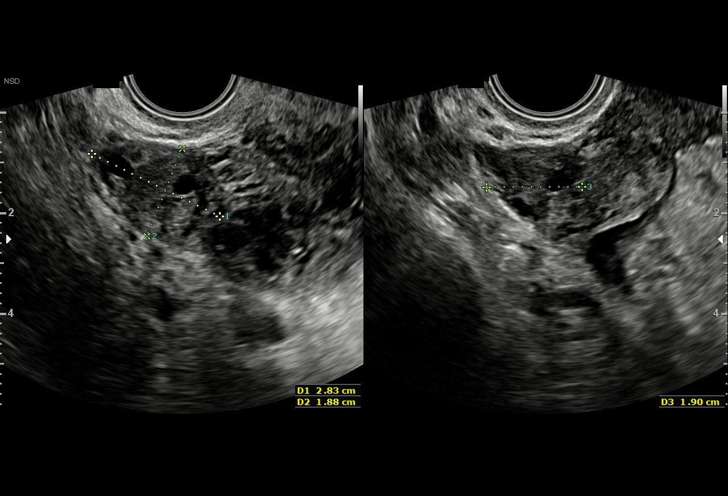
[im 19/37]
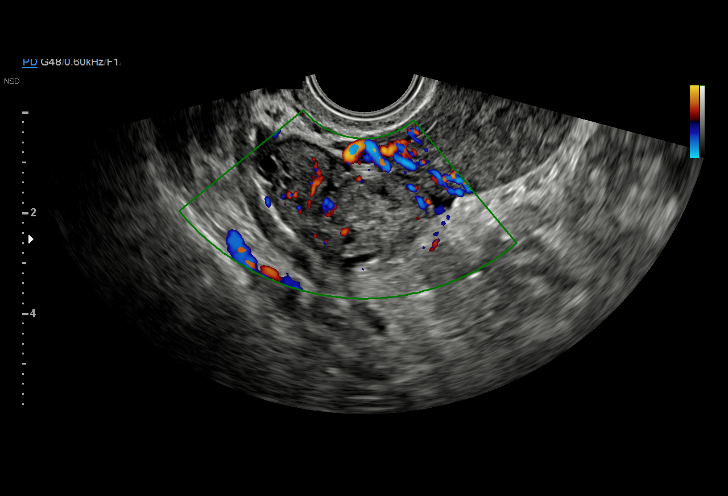
[im 21/37]
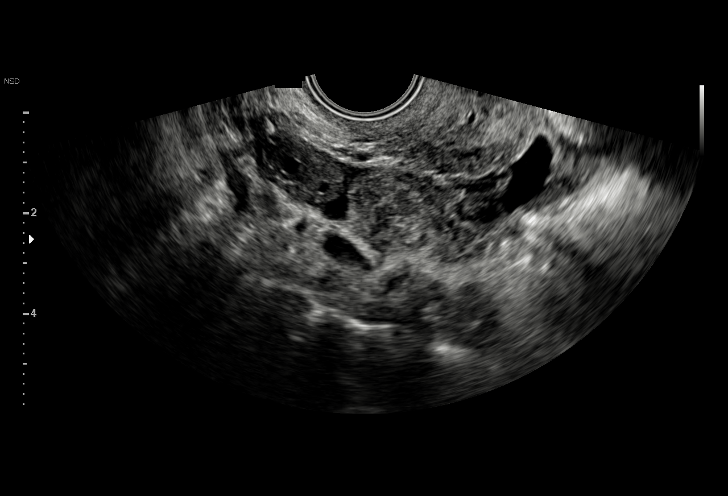
[im 23/37]
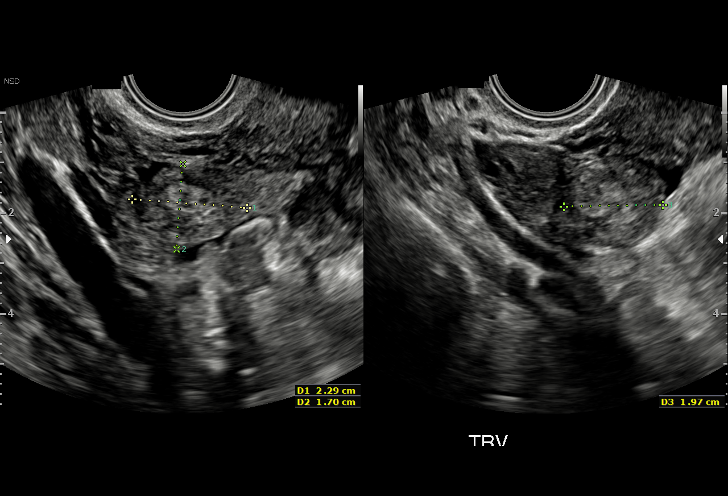
[im 26/37]
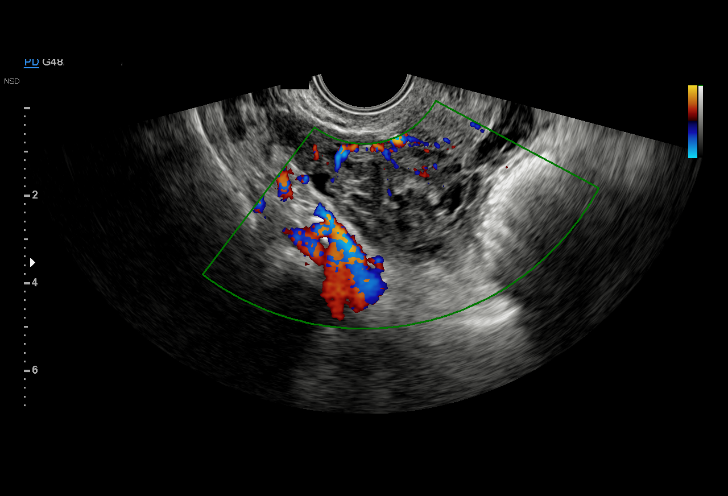
[im 29/37]
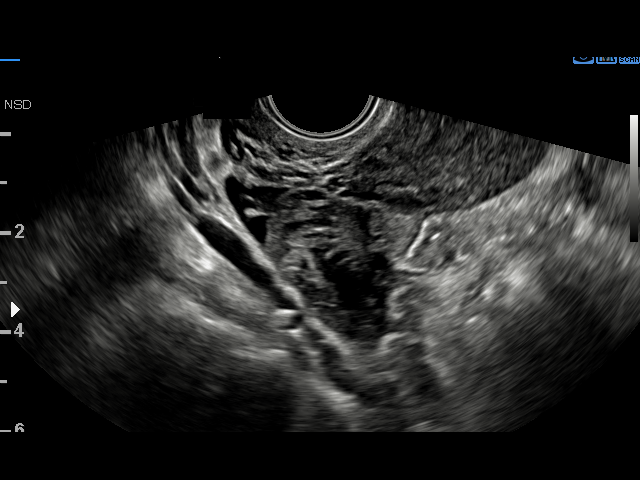
[im 31/37]
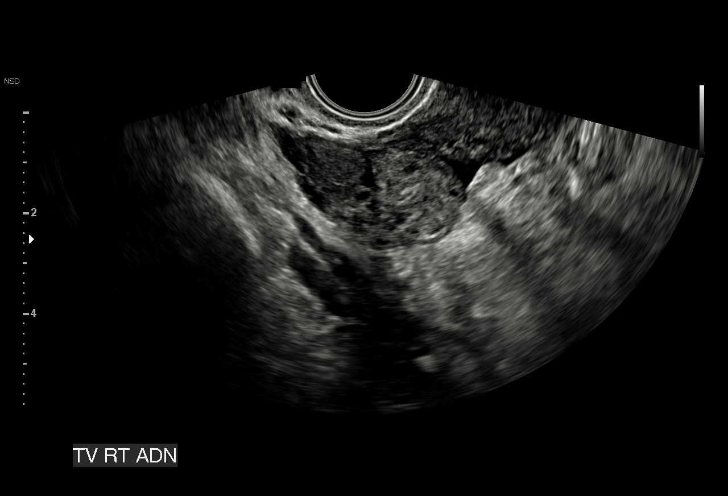
[im 34/37]
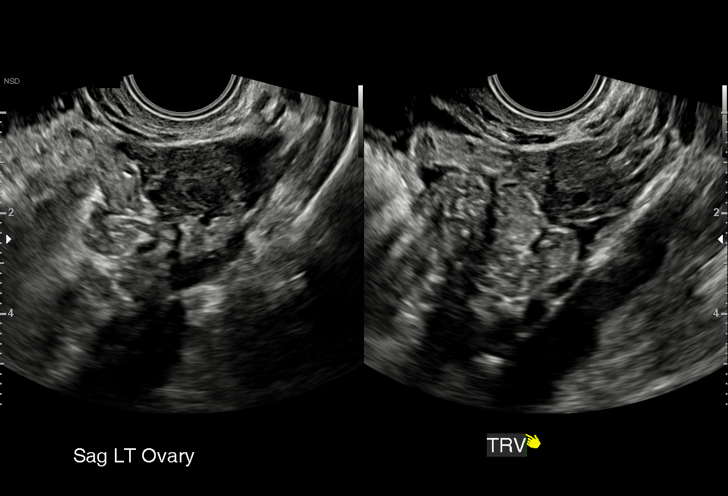
[im 37/37]
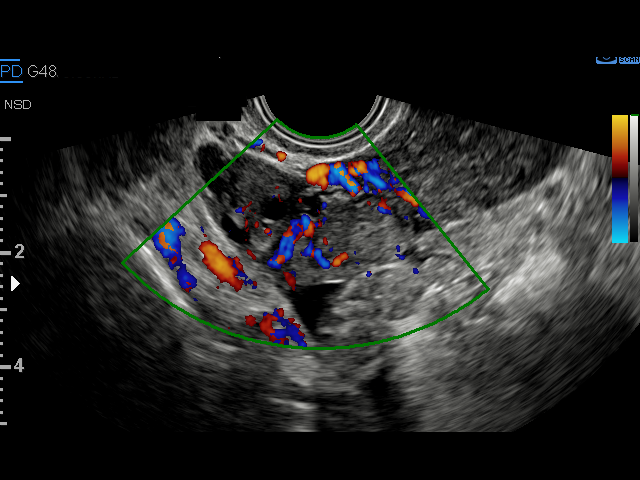

[15 of 28 positions shown; findings below may reference images not displayed]

FINDINGS: Anteverted uterus is normal in size and configuration, with no
uterine fibroids or other myometrial abnormalities. No intrauterine
gestational sac. Thin endometrium (4 mm bilayer thickness). Trace
heterogeneous fluid in the endometrial cavity.

Small to moderate free fluid in the pelvic cul-de-sac and right
adnexa, not appreciably changed.

Right ovary measures 2.8 x 1.9 x 1.9 cm. There is a heterogeneous
2.3 x 1.7 x 2.0 cm right adnexal mass separate from the right ovary,
located between the right ovary and uterus, compatible with right
tubal ectopic gestation, previously 2.4 x 1.8 x 2.1 cm on 08/16/2017
using similar measurement technique, stable to slightly decreased.
No yolk sac or embryo within this right adnexal mass. Additional
heterogeneous hypoechoic regions in the right adnexa are unchanged
and compatible with hematosalpinx.

Left ovary measures 2.3 x 1.4 x 1.9 cm. No left ovarian or left
adnexal mass.
IMPRESSION: 1. Right tubal 2.3 x 1.7 x 2.0 cm ectopic gestation, stable to
slightly decreased in size since 08/16/2017 scan. No yolk sac or
embryo within this mass.
2. Stable hematosalpinx in the right adnexa. Stable small to
moderate free fluid in the right adnexa and pelvic cul-de-sac.
3. Thin endometrium with trace blood products in the endometrial
cavity.
